# Patient Record
Sex: Male | Born: 1990 | Race: Black or African American | Hispanic: No | Marital: Single | Smoking: Current every day smoker
Health system: Southern US, Community
[De-identification: ages and names within clinical notes are randomized; demographics above are authoritative.]

## PROBLEM LIST (undated history)

## (undated) DIAGNOSIS — I10 Essential (primary) hypertension: Secondary | ICD-10-CM

## (undated) HISTORY — PX: OTHER SURGICAL HISTORY: SHX169

## (undated) HISTORY — DX: Essential (primary) hypertension: I10

---

## 2008-03-09 ENCOUNTER — Emergency Department (HOSPITAL_COMMUNITY): Admission: EM | Admit: 2008-03-09 | Discharge: 2008-03-10 | Payer: Self-pay | Admitting: Emergency Medicine

## 2008-03-11 ENCOUNTER — Emergency Department (HOSPITAL_COMMUNITY): Admission: EM | Admit: 2008-03-11 | Discharge: 2008-03-11 | Payer: Self-pay | Admitting: Emergency Medicine

## 2009-04-25 ENCOUNTER — Ambulatory Visit: Payer: Self-pay | Admitting: Diagnostic Radiology

## 2009-04-25 ENCOUNTER — Emergency Department (HOSPITAL_BASED_OUTPATIENT_CLINIC_OR_DEPARTMENT_OTHER): Admission: EM | Admit: 2009-04-25 | Discharge: 2009-04-25 | Payer: Self-pay | Admitting: Emergency Medicine

## 2009-10-19 ENCOUNTER — Emergency Department (HOSPITAL_BASED_OUTPATIENT_CLINIC_OR_DEPARTMENT_OTHER): Admission: EM | Admit: 2009-10-19 | Discharge: 2009-10-19 | Payer: Self-pay | Admitting: Emergency Medicine

## 2009-10-19 ENCOUNTER — Ambulatory Visit: Payer: Self-pay | Admitting: Diagnostic Radiology

## 2010-06-20 LAB — DIFFERENTIAL
Basophils Absolute: 0 10*3/uL (ref 0.0–0.1)
Eosinophils Relative: 4 % (ref 0–5)
Lymphocytes Relative: 29 % (ref 12–46)
Lymphs Abs: 2.4 10*3/uL (ref 0.7–4.0)
Monocytes Absolute: 0.5 10*3/uL (ref 0.1–1.0)
Monocytes Relative: 6 % (ref 3–12)
Neutro Abs: 5.2 10*3/uL (ref 1.7–7.7)
Neutrophils Relative %: 60 % (ref 43–77)

## 2010-06-20 LAB — CBC
Hemoglobin: 14.5 g/dL (ref 13.0–17.0)
MCH: 30 pg (ref 26.0–34.0)
MCHC: 33.5 g/dL (ref 30.0–36.0)
MCV: 89.5 fL (ref 78.0–100.0)

## 2010-06-20 LAB — RAPID STREP SCREEN (MED CTR MEBANE ONLY): Streptococcus, Group A Screen (Direct): POSITIVE — AB
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
## 2016-09-21 ENCOUNTER — Ambulatory Visit (HOSPITAL_COMMUNITY)
Admission: EM | Admit: 2016-09-21 | Discharge: 2016-09-21 | Disposition: A | Discharge status: Home / Self Care | Payer: Self-pay | Attending: Family Medicine | Admitting: Family Medicine

## 2016-09-21 ENCOUNTER — Encounter (HOSPITAL_COMMUNITY): Payer: Self-pay | Admitting: Emergency Medicine

## 2016-09-21 DIAGNOSIS — K029 Dental caries, unspecified: Secondary | ICD-10-CM

## 2016-09-21 MED ORDER — HYDROCODONE-ACETAMINOPHEN 5-325 MG PO TABS: 1.0000 | ORAL_TABLET | Freq: Four times a day (QID) | ORAL | 0 refills | Status: DC | PRN

## 2016-09-21 MED ORDER — CLINDAMYCIN HCL 150 MG PO CAPS: 150.0000 mg | ORAL_CAPSULE | Freq: Four times a day (QID) | ORAL | 0 refills | Status: DC

## 2016-09-21 NOTE — Discharge Instructions (Signed)
Call the dentist listed below.  He may want to go [facility] is providing dental care twice a year.

## 2016-09-21 NOTE — ED Provider Notes (Signed)
[facility]    CSN: [id number] Arrival date & time: 09/21/16  1016     History   Chief Complaint Chief Complaint  Patient presents with  . Dental Pain    HPI Evan Ramos is a 26 y.o. male.   The patient presented to the [facility] with a complaint of dental pain that started 2 days ago and has progressed into a sore throat as well. He took penicillin yesterday but the pain is worse today. The area of discomfort is tooth #2. 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 He has no dental insurance.      History reviewed. No pertinent past medical history.  There are no active problems to display for this patient.   History reviewed. No pertinent surgical history.     Home Medications    Prior to Admission medications   Medication Sig Start Date End Date Taking? Authorizing Provider  clindamycin (CLEOCIN) 150 MG capsule Take 1 capsule (150 mg total) by mouth every 6 (six) hours. 09/21/16   Elvina Sidle, MD  HYDROcodone-acetaminophen (NORCO) 5-325 MG tablet Take 1 tablet by mouth every 6 (six) hours as needed for moderate pain. 09/21/16   Elvina Sidle, MD    Family History History reviewed. No pertinent family history.  Social History Social History  Substance Use Topics  . Smoking status: Current Every Day Smoker    Packs/day: 2.00    Years: 4.00    Types: Cigarettes  . Smokeless tobacco: Never Used  . Alcohol use No     Allergies   Patient has no known allergies.   Review of Systems Review of Systems  HENT: Positive for dental problem.   All other systems reviewed and are negative.    Physical Exam Triage Vital Signs ED Triage Vitals  Enc Vitals Group     BP 09/21/16 1055 137/70     Pulse Rate 09/21/16 1055 88     Resp 09/21/16 1055 18     Temp 09/21/16 1055 98.3 F (36.8 C)     Temp Source 09/21/16 1055 Oral     SpO2 09/21/16 1055 100 %     Weight --      Height --      Head Circumference  --      Peak Flow --      Pain Score 09/21/16 1053 10     Pain Loc --      Pain Edu? --      Excl. in GC? --    No data found.   Updated Vital Signs BP 137/70 (BP Location: Right Arm)   Pulse 88   Temp 98.3 F (36.8 C) (Oral)   Resp 18   SpO2 100%     Physical Exam  Constitutional: He is oriented to person, place, and time. He appears well-developed and well-nourished.  HENT:  Right Ear: External ear normal.  Left Ear: External ear normal.  Multiple dental caries throughout his mouth, most notably in tooth #2  Eyes: Conjunctivae and EOM are normal. Pupils are equal, round, and reactive to light.  Neck: Normal range of motion. Neck supple.  Cardiovascular: Normal rate.   Pulmonary/Chest: Effort normal.  Musculoskeletal: Normal range of motion.  Neurological: He is alert and oriented to person, place, and time.  Skin: Skin is warm and dry.  Nursing note and vitals reviewed.    UC Treatments / Results  Labs (all labs ordered are listed, but only abnormal  results are displayed) Labs Reviewed - No data to display  EKG  EKG Interpretation None       Radiology No results found.  Procedures Procedures (including critical care time)  Medications Ordered in UC Medications - No data to display   Initial Impression / Assessment and Plan / UC Course  I have reviewed the triage vital signs and the nursing notes.  Pertinent labs & imaging results that were available during my care of the patient were reviewed by me and considered in my medical decision making (see chart for details).     Final Clinical Impressions(s) / UC Diagnoses   Final diagnoses:  Dental caries    New Prescriptions New Prescriptions   CLINDAMYCIN (CLEOCIN) 150 MG CAPSULE    Take 1 capsule (150 mg total) by mouth every 6 (six) hours.   HYDROCODONE-ACETAMINOPHEN (NORCO) 5-325 MG TABLET    Take 1 tablet by mouth every 6 (six) hours as needed for moderate pain.     Elvina SidleLauenstein, Lorenza Winkleman,  MD 09/21/16 1105

## 2016-09-21 NOTE — ED Triage Notes (Signed)
[facility] with a complaint of dental pain that started 2 days ago and has progressed into a sore throat as well.

## 2020-10-23 ENCOUNTER — Emergency Department (HOSPITAL_BASED_OUTPATIENT_CLINIC_OR_DEPARTMENT_OTHER)
Admission: EM | Admit: 2020-10-23 | Discharge: 2020-10-24 | Disposition: A | Discharge status: Home / Self Care | Payer: Self-pay | Attending: Emergency Medicine | Admitting: Emergency Medicine

## 2020-10-23 ENCOUNTER — Encounter (HOSPITAL_BASED_OUTPATIENT_CLINIC_OR_DEPARTMENT_OTHER): Payer: Self-pay | Admitting: Emergency Medicine

## 2020-10-23 ENCOUNTER — Other Ambulatory Visit: Payer: Self-pay

## 2020-10-23 ENCOUNTER — Emergency Department (HOSPITAL_BASED_OUTPATIENT_CLINIC_OR_DEPARTMENT_OTHER): Payer: Self-pay

## 2020-10-23 DIAGNOSIS — G4452 New daily persistent headache (NDPH): Secondary | ICD-10-CM

## 2020-10-23 DIAGNOSIS — R030 Elevated blood-pressure reading, without diagnosis of hypertension: Secondary | ICD-10-CM

## 2020-10-23 DIAGNOSIS — F17210 Nicotine dependence, cigarettes, uncomplicated: Secondary | ICD-10-CM | POA: Insufficient documentation

## 2020-10-23 NOTE — ED Provider Notes (Signed)
[facility] Provider Note: Evan Dell, MD, FACEP  CSN: [id number] MRN: [id number] ARRIVAL: 10/23/20 at 2236 ROOM: MH07/MH07   CHIEF COMPLAINT  Headache   HISTORY OF PRESENT ILLNESS  10/23/20 10:57 PM Evan Ramos is a 30 y.o. male who has been having headaches for about the past 3 weeks.  He previously had no history of headaches and has no personal or family history of migraines.  He describes the headaches as occurring multiple times a day.  They began in the left periorbital area and then subsequently moved to the left occiput.  He describes them as aching and throbbing and rates their pain as a 6 out of 10.  It is not the worst headache he has ever had but the frequency is concerning him.  He has no associated blurred vision, or neurodeficits, ptosis or lacrimation.  They usually resolve with Tylenol.  He checked his blood pressure earlier and found to be high so he was concerned that the blood pressure may be causing his headaches.  He has no associated numbness or weakness.  He has no associated photophobia, nausea or vomiting.   History reviewed. No pertinent past medical history.  History reviewed. No pertinent surgical history.  Family History  Problem Relation Age of Onset   Cancer Mother    Cancer Father    Hypertension Other     Social History   Tobacco Use   Smoking status: Every Day    Packs/day: 1.00    Years: 4.00    Pack years: 4.00    Types: Cigarettes   Smokeless tobacco: Never  Vaping Use   Vaping Use: Never used  Substance Use Topics   Alcohol use: Yes    Comment: socially   Drug use: Yes    Types: Marijuana    Prior to Admission medications   Medication Sig Start Date End Date Taking? Authorizing Provider  amLODipine (NORVASC) 5 MG tablet Take 1 tablet (5 mg total) by mouth daily. 10/24/20  Yes Geoffery Aultman, MD    Allergies Patient has no known allergies.   REVIEW OF SYSTEMS  Negative except as noted here or in the  History of Present Illness.   PHYSICAL EXAMINATION  Initial Vital Signs Blood pressure (!) 144/95, pulse 76, temperature 98.6 F (37 C), temperature source Oral, resp. rate 18, height 5\' 5"  (1.651 m), weight 83.9 kg, SpO2 100 %.  Examination General: Well-developed, well-nourished male in no acute distress; appearance consistent with age of record HENT: normocephalic; atraumatic Eyes: pupils equal, round and reactive to light; extraocular muscles intact Neck: supple Heart: regular rate and rhythm Lungs: clear to auscultation bilaterally Abdomen: soft; nondistended; nontender; bowel sounds present Extremities: No deformity; full range of motion; pulses normal Neurologic: Awake, alert and oriented; motor function intact in all extremities and symmetric; sensation intact and symmetric in upper extremities, lower extremities and face; no facial droop Skin: Warm and dry Psychiatric: Normal mood and affect   RESULTS  Summary of this visit's results, reviewed and interpreted by myself:   EKG Interpretation  Date/Time:    Ventricular Rate:    PR Interval:    QRS Duration:   QT Interval:    QTC Calculation:   R Axis:     Text Interpretation:         Laboratory Studies: No results found for this or any previous visit (from the past 24 hour(s)). Imaging Studies: CT Head Wo Contrast  Result Date: 10/23/2020 CLINICAL DATA:  Headache, chronic, new  features or increased frequency Daily headaches for 2 weeks. EXAM: CT HEAD WITHOUT CONTRAST TECHNIQUE: Contiguous axial images were obtained from the base of the skull through the vertex without intravenous contrast. COMPARISON:  Remote head CT 10/19/2009 FINDINGS: Brain: No intracranial hemorrhage, mass effect, or midline shift. No hydrocephalus. The basilar cisterns are patent. No evidence of territorial infarct or acute ischemia. No extra-axial or intracranial fluid collection. Vascular: No hyperdense vessel or unexpected calcification.  Skull: No fracture or focal lesion. Sinuses/Orbits: Tiny mucous retention cyst in the right maxillary sinus and left side of sphenoid sinus. No fluid levels or significant mucosal thickening. The mastoid air cells are clear. Unremarkable orbits. Other: None. IMPRESSION: Negative noncontrast head CT. Electronically Signed   By: Narda Rutherford M.D.   On: 10/23/2020 23:33    ED COURSE and MDM  Nursing notes, initial and subsequent vitals signs, including pulse oximetry, reviewed and interpreted by myself.  Vitals:   10/23/20 2244 10/23/20 2246  BP:  (!) 144/95  Pulse:  76  Resp:  18  Temp:  98.6 F (37 C)  TempSrc:  Oral  SpO2:  100%  Weight: 83.9 kg   Height: 5\' 5"  (1.651 m)    Medications  naproxen (NAPROSYN) tablet 500 mg (has no administration in time range)  amLODipine (NORVASC) tablet 5 mg (has no administration in time range)    Patient advised of reassuring CT scan.  His blood pressures have been mildly elevated here and I think it is reasonable to start him on a calcium channel blocker pending establishment with a PCP.  He was advised of the dangers long-term of untreated hypertension.  His headaches are not severe and as they are controlled with over-the-counter medications I do not believe any aggressive intervention is needed.  PROCEDURES  Procedures   ED DIAGNOSES     ICD-10-CM   1. New persistent daily headache  G44.52     2. Elevated blood pressure reading without diagnosis of hypertension  R03.0          Jahmire Ruffins, MD 10/24/20 0005

## 2020-10-23 NOTE — ED Triage Notes (Signed)
Pt states he has been having headaches everyday for the past 2 weeks  Pt states he checked his blood pressure tonight and it read high so he came in

## 2020-10-24 MED ORDER — AMLODIPINE BESYLATE 5 MG PO TABS: 5.0000 mg | ORAL_TABLET | Freq: Every day | ORAL | 0 refills | Status: AC

## 2020-10-24 MED ORDER — NAPROXEN 250 MG PO TABS
500.0000 mg | ORAL_TABLET | Freq: Once | ORAL | Status: AC
  Administered 2020-10-24: 500 mg via ORAL
  Filled 2020-10-24: qty 2

## 2020-10-24 MED ORDER — AMLODIPINE BESYLATE 5 MG PO TABS
5.0000 mg | ORAL_TABLET | Freq: Once | ORAL | Status: AC
  Administered 2020-10-24: 5 mg via ORAL
  Filled 2020-10-24: qty 1

## 2021-01-14 ENCOUNTER — Ambulatory Visit: Payer: Self-pay | Admitting: Neurology

## 2021-05-19 ENCOUNTER — Emergency Department (HOSPITAL_BASED_OUTPATIENT_CLINIC_OR_DEPARTMENT_OTHER): Payer: Self-pay

## 2021-05-19 ENCOUNTER — Encounter (HOSPITAL_BASED_OUTPATIENT_CLINIC_OR_DEPARTMENT_OTHER): Payer: Self-pay

## 2021-05-19 ENCOUNTER — Other Ambulatory Visit: Payer: Self-pay

## 2021-05-19 ENCOUNTER — Emergency Department (HOSPITAL_BASED_OUTPATIENT_CLINIC_OR_DEPARTMENT_OTHER)
Admission: EM | Admit: 2021-05-19 | Discharge: 2021-05-19 | Disposition: A | Discharge status: Home / Self Care | Payer: Self-pay | Attending: Emergency Medicine | Admitting: Emergency Medicine

## 2021-05-19 DIAGNOSIS — R0602 Shortness of breath: Secondary | ICD-10-CM | POA: Insufficient documentation

## 2021-05-19 DIAGNOSIS — D72829 Elevated white blood cell count, unspecified: Secondary | ICD-10-CM | POA: Insufficient documentation

## 2021-05-19 DIAGNOSIS — R072 Precordial pain: Secondary | ICD-10-CM | POA: Insufficient documentation

## 2021-05-19 LAB — COMPREHENSIVE METABOLIC PANEL
ALT: 36 U/L (ref 0–44)
AST: 23 U/L (ref 15–41)
Albumin: 4.2 g/dL (ref 3.5–5.0)
Alkaline Phosphatase: 64 U/L (ref 38–126)
Anion gap: 7 (ref 5–15)
BUN: 11 mg/dL (ref 6–20)
CO2: 25 mmol/L (ref 22–32)
Calcium: 9.5 mg/dL (ref 8.9–10.3)
Chloride: 104 mmol/L (ref 98–111)
Creatinine, Ser: 0.84 mg/dL (ref 0.61–1.24)
GFR, Estimated: >60 mL/min (ref >60)
Glucose, Bld: 99 mg/dL (ref 70–99)
Potassium: 4.2 mmol/L (ref 3.5–5.1)
Sodium: 136 mmol/L (ref 135–145)
Total Bilirubin: 0.6 mg/dL (ref 0.3–1.2)
Total Protein: 7.6 g/dL (ref 6.5–8.1)

## 2021-05-19 LAB — CBC WITH DIFFERENTIAL/PLATELET
Abs Immature Granulocytes: 0.08 10*3/uL — ABNORMAL HIGH (ref 0.00–0.07)
Basophils Absolute: 0.1 10*3/uL (ref 0.0–0.1)
Basophils Relative: 0 %
Eosinophils Absolute: 0.3 10*3/uL (ref 0.0–0.5)
Eosinophils Relative: 1 %
HCT: 42.4 % (ref 39.0–52.0)
Hemoglobin: 14.4 g/dL (ref 13.0–17.0)
Immature Granulocytes: 0 %
Lymphocytes Relative: 19 %
Lymphs Abs: 3.8 10*3/uL (ref 0.7–4.0)
MCH: 30.1 pg (ref 26.0–34.0)
MCHC: 34 g/dL (ref 30.0–36.0)
MCV: 88.7 fL (ref 80.0–100.0)
Monocytes Absolute: 1.2 10*3/uL — ABNORMAL HIGH (ref 0.1–1.0)
Monocytes Relative: 6 %
Neutro Abs: 14.9 10*3/uL — ABNORMAL HIGH (ref 1.7–7.7)
Neutrophils Relative %: 74 %
Platelets: 253 10*3/uL (ref 150–400)
RBC: 4.78 MIL/uL (ref 4.22–5.81)
RDW: 13 % (ref 11.5–15.5)
Smear Review: NORMAL
WBC: 20.3 10*3/uL — ABNORMAL HIGH (ref 4.0–10.5)
nRBC: 0 % (ref 0.0–0.2)

## 2021-05-19 LAB — TROPONIN I (HIGH SENSITIVITY)
Troponin I (High Sensitivity): 3 ng/L (ref <18)
Troponin I (High Sensitivity): 4 ng/L (ref <18)

## 2021-05-19 LAB — D-DIMER, QUANTITATIVE: D-Dimer, Quant: <0.27 ug/mL-FEU (ref 0.00–0.50)

## 2021-05-19 NOTE — ED Provider Notes (Signed)
Emergency Department Provider Note   I have reviewed the triage vital signs and the nursing notes.   HISTORY  Chief Complaint Chest Pain   HPI Evan Ramos is a 31 y.o. male with no known past medical history presents to the emergency department with left-sided chest discomfort.  Symptoms have been intermittent over the past week.  Describes a left chest tightness mainly in the morning.  He states that 1 or 2 mornings he did wake up with some diaphoresis.  He states that pain last for around 5 minutes and then resolves.  He would do things like stretch or go outside to get some air and symptoms would improve.  He would not have chest pain the remaining day.  No exertional or pleuritic discomfort.  Today he was driving and noticed some pain in the chest again and so decided to come in for evaluation.  No family history of ACS or early cardiac death.  No pain or swelling in the legs.  He did note some mild shortness of breath with symptoms today.  No active pain currently.     History reviewed. No pertinent past medical history.  Review of Systems  Constitutional: No fever/chills Eyes: No visual changes. ENT: No sore throat. Cardiovascular: Positive chest pain. Respiratory: Denies shortness of breath. Gastrointestinal: No abdominal pain.  No nausea, no vomiting.  No diarrhea.  No constipation. Genitourinary: Negative for dysuria. Musculoskeletal: Negative for back pain. Skin: Negative for rash. Neurological: Negative for headaches, focal weakness or numbness.   ____________________________________________   PHYSICAL EXAM:  VITAL SIGNS: ED Triage Vitals  Enc Vitals Group     BP 05/19/21 1120 (!) 141/88     Pulse Rate 05/19/21 1120 65     Resp 05/19/21 1120 (!) 22     Temp 05/19/21 1120 98.4 F (36.9 C)     Temp Source 05/19/21 1120 Oral     SpO2 05/19/21 1120 100 %     Weight 05/19/21 1118 185 lb (83.9 kg)   Constitutional: Alert and oriented. Well appearing  and in no acute distress. Eyes: Conjunctivae are normal.  Head: Atraumatic. Nose: No congestion/rhinnorhea. Mouth/Throat: Mucous membranes are moist.  Neck: No stridor.   Cardiovascular: Normal rate, regular rhythm. Good peripheral circulation. Grossly normal heart sounds.   Respiratory: Normal respiratory effort.  No retractions. Lungs CTAB. Gastrointestinal: Soft and nontender. No distention.  Musculoskeletal: No gross deformities of extremities. Neurologic:  Normal speech and language.  Skin:  Skin is warm, dry and intact. No rash noted.   ____________________________________________   LABS (all labs ordered are listed, but only abnormal results are displayed)  Labs Reviewed  CBC WITH DIFFERENTIAL/PLATELET - Abnormal; Notable for the following components:      Result Value   WBC 20.3 (*)    Neutro Abs 14.9 (*)    Monocytes Absolute 1.2 (*)    Abs Immature Granulocytes 0.08 (*)    All other components within normal limits  COMPREHENSIVE METABOLIC PANEL  D-DIMER, QUANTITATIVE  TROPONIN I (HIGH SENSITIVITY)  TROPONIN I (HIGH SENSITIVITY)   ____________________________________________  EKG   EKG Interpretation  Date/Time:  Tuesday May 19 2021 11:19:14 EST Ventricular Rate:  59 PR Interval:  144 QRS Duration: 84 QT Interval:  368 QTC Calculation: 364 R Axis:   13 Text Interpretation: Sinus bradycardia with sinus arrhythmia Otherwise normal ECG No previous ECGs available Confirmed by Nanda Quinton [phone]) on 05/19/2021 11:21:55 AM        ____________________________________________  RADIOLOGY  Darletta Moll  Chest 2 View  Result Date: 05/19/2021 CLINICAL DATA:  Chest pain. EXAM: CHEST - 2 VIEW COMPARISON:  April 25, 2009. FINDINGS: The heart size and mediastinal contours are within normal limits. Both lungs are clear. The visualized skeletal structures are unremarkable. IMPRESSION: No active cardiopulmonary disease. Electronically Signed   By: Marijo Conception M.D.   On:  05/19/2021 11:41    ____________________________________________   PROCEDURES  Procedure(s) performed:   Procedures  None  ____________________________________________   INITIAL IMPRESSION / ASSESSMENT AND PLAN / ED COURSE  Pertinent labs & imaging results that were available during my care of the patient were reviewed by me and considered in my medical decision making (see chart for details).   This patient is Presenting for Evaluation of CP, which does require a range of treatment options, and is a complaint that involves a high risk of morbidity and mortality.  The Differential Diagnoses includes all life-threatening causes for chest pain. This includes but is not exclusive to acute coronary syndrome, aortic dissection, pulmonary embolism, cardiac tamponade, community-acquired pneumonia, pericarditis, musculoskeletal chest wall pain, etc.  I decided to review pertinent External Data, and in summary no prior cardiac workup in our system.   Clinical Laboratory Tests Ordered, included troponins which are negative x2.  D-dimer which is within normal limits.  Patient does have leukocytosis of 20.  He is afebrile.  He is not having infection signs or symptoms either here or at home.  Kidney function is within normal limits.  LFTs and bilirubin are normal.  Electrolytes normal.  Radiologic Tests Ordered, included CXR. I independently interpreted the images and agree with radiology interpretation.   Cardiac Monitor Tracing which shows NSR  Social Determinants of Health Risk patient is a smoker.    Medical Decision Making: Summary:  Patient presents to the emergency department for evaluation of chest discomfort.  Pain has some typical but also atypical qualities.  Seems to be mostly in the morning but today occurred with driving.  His EKG interpreted by me as above shows nonspecific ST changes and elevated J-point but no clear ischemic change or reciprocal changes in other leads.  His  blood pressure is elevated from time to time according to him but does not have this history.  He is a smoker.  No strong family history of ACS.  His troponins are negative x2 and D-dimer is negative as well.  Low risk by Rock Nephew for PE.  Given his EKG I do plan for referral to cardiology for further evaluation.  He is also a smoker which increases his risk.   His leukocytosis to 20 is somewhat difficult to interpret.  He is not having any signs or symptoms of infection such as pneumonia, intra-abdominal process, myocarditis.  Myocarditis further evaluated with negative troponins and no ectopy on telemetry monitoring.  He does not appear to be in decompensated CHF.   Reevaluation with update and discussion with patient regarding plan for d/c and Cardiology/PCP follow up. No active pain. Discussed strict ED return precautions.   Disposition: discharge  ____________________________________________  FINAL CLINICAL IMPRESSION(S) / ED DIAGNOSES  Final diagnoses:  Precordial chest pain    Note:  This document was prepared using Dragon voice recognition software and may include unintentional dictation errors.  Nanda Quinton, MD, [facility]    Karrington Mccravy, Wonda Olds, MD 05/19/21 [phone]

## 2021-05-19 NOTE — ED Triage Notes (Addendum)
Pt reports left sided chest pain x one week. Initially pain only in morning, now pain is throughout day. Today pt reports SOB

## 2021-05-19 NOTE — Discharge Instructions (Signed)

## 2021-05-19 NOTE — ED Notes (Addendum)
Patient transported to X-ray 

## 2021-05-20 ENCOUNTER — Encounter: Payer: Self-pay | Admitting: Cardiology

## 2021-05-20 ENCOUNTER — Ambulatory Visit (HOSPITAL_BASED_OUTPATIENT_CLINIC_OR_DEPARTMENT_OTHER)
Admission: RE | Admit: 2021-05-20 | Discharge: 2021-05-20 | Disposition: A | Discharge status: Home / Self Care | Payer: Self-pay | Source: Ambulatory Visit | Attending: Cardiology | Admitting: Cardiology

## 2021-05-20 ENCOUNTER — Ambulatory Visit (INDEPENDENT_AMBULATORY_CARE_PROVIDER_SITE_OTHER): Payer: Self-pay | Admitting: Cardiology

## 2021-05-20 VITALS — BP 134/66 | HR 55 | Ht 65.0 in | Wt 187.0 lb

## 2021-05-20 DIAGNOSIS — I10 Essential (primary) hypertension: Secondary | ICD-10-CM

## 2021-05-20 DIAGNOSIS — R072 Precordial pain: Secondary | ICD-10-CM

## 2021-05-20 DIAGNOSIS — Z720 Tobacco use: Secondary | ICD-10-CM

## 2021-05-20 LAB — ECHOCARDIOGRAM COMPLETE
AR max vel: 2.35 cm2
AV Area VTI: 2.41 cm2
AV Area mean vel: 2.14 cm2
AV Mean grad: 4 mmHg
AV Peak grad: 8.1 mmHg
Ao pk vel: 1.42 m/s
Area-P 1/2: 3.6 cm2
Height: 65 in
S' Lateral: 3.3 cm
Weight: 2992 oz

## 2021-05-20 NOTE — Patient Instructions (Signed)
°  Testing/Procedures:  Your physician has requested that you have an echocardiogram. Echocardiography is a painless test that uses sound waves to create images of your heart. It provides your doctor with information about the size and shape of your heart and how well your hearts chambers and valves are working. This procedure takes approximately one hour. There are no restrictions for this procedure. [facility] [facility]-1ST FLOOR IMAGING DEPARTMENT   Follow-Up: At [facility], you and your health needs are our priority.  As part of our continuing mission to provide you with exceptional heart care, we have created designated Provider Care Teams.  These Care Teams include your primary Cardiologist (physician) and Advanced Practice Providers (APPs -  Physician Assistants and Nurse Practitioners) who all work together to provide you with the care you need, when you need it.  We recommend signing up for the patient portal called "MyChart".  Sign up information is provided on this After Visit Summary.  MyChart is used to connect with patients for Virtual Visits (Telemedicine).  Patients are able to view lab/test results, encounter notes, upcoming appointments, etc.  Non-urgent messages can be sent to your provider as well.   To learn more about what you can do with MyChart, go to [email or web address].    Your next appointment:    AS NEEDED

## 2021-05-20 NOTE — Progress Notes (Signed)
°  Echocardiogram 2D Echocardiogram has been performed.  Roosvelt Maser F 05/20/2021, 1:48 PM

## 2021-05-20 NOTE — Progress Notes (Signed)
Referring-Evan Long MD Reason for referral-Chest pain   HPI: 31 year old male for evaluation of chest pain at request of Vonna Kotyk MD.  Patient seen for chest pain in the emergency May 19, 2021.  Chest x-ray showed no infiltrates.  Troponins were normal.  Potassium was 4.2, creatinine 0.84, liver functions normal, hemoglobin 14.4 and normal D-dimer.  Patient states that he has had intermittent chest pain for 1 week.  It is in the substernal area and described as a "gas" sensation.  Typically lasts 5 to 10 minutes and resolve spontaneously.  Some increase with inspiration or certain movements.  He does not have exertional chest pain.  The pain does not radiate and there are no associated symptoms.  Cardiology now asked to evaluate.  Note he denies dyspnea on exertion, orthopnea, PND, pedal edema, exertional chest pain or syncope.  Current Outpatient Medications  Medication Sig Dispense Refill   amLODipine (NORVASC) 5 MG tablet Take 1 tablet (5 mg total) by mouth daily. 30 tablet 0   No current facility-administered medications for this visit.    No Known Allergies   Past Medical History:  Diagnosis Date   Hypertension     Past Surgical History:  Procedure Laterality Date   No prior surgery      Social History   Socioeconomic History   Marital status: Single    Spouse name: Not on file   Number of children: 2   Years of education: Not on file   Highest education level: Not on file  Occupational History   Not on file  Tobacco Use   Smoking status: Every Day    Packs/day: 1.00    Years: 4.00    Pack years: 4.00    Types: Cigarettes   Smokeless tobacco: Never  Vaping Use   Vaping Use: Never used  Substance and Sexual Activity   Alcohol use: Yes    Comment: socially   Drug use: Yes    Types: Marijuana   Sexual activity: Not on file  Other Topics Concern   Not on file  Social History Narrative   Not on file   Social Determinants of Health   Financial  Resource Strain: Not on file  Food Insecurity: Not on file  Transportation Needs: Not on file  Physical Activity: Not on file  Stress: Not on file  Social Connections: Not on file  Intimate Partner Violence: Not on file    Family History  Problem Relation Age of Onset   Cancer Mother    Cancer Father    Hypertension Other     ROS: no fevers or chills, productive cough, hemoptysis, dysphasia, odynophagia, melena, hematochezia, dysuria, hematuria, rash, seizure activity, orthopnea, PND, pedal edema, claudication. Remaining systems are negative.  Physical Exam:   Blood pressure 134/66, pulse (!) 55, height 5\' 5"  (1.651 m), weight 187 lb (84.8 kg), SpO2 98 %.  General:  Well developed/well nourished in NAD Skin warm/dry Patient not depressed No peripheral clubbing Back-normal HEENT-normal/normal eyelids Neck supple/normal carotid upstroke bilaterally; no bruits; no JVD; no thyromegaly chest - CTA/ normal expansion CV - RRR/normal S1 and S2; no murmurs, rubs or gallops;  PMI nondisplaced Abdomen -NT/ND, no HSM, no mass, + bowel sounds, no bruit 2+ femoral pulses, no bruits Ext-no edema, chords, 2+ DP Neuro-grossly nonfocal  ECG -May 19, 2021-sinus bradycardia with early repolarization abnormality.  Personally reviewed  A/P  1 chest pain-symptoms atypical.  Increase with inspiration.  Note recent electrocardiogram showed early repolarization but no other  ST changes.  Troponins and D-dimer negative.  I will arrange an echocardiogram to rule out pericardial effusion and to evaluate wall motion.  If normal we will not pursue further evaluation.  There may be also be a contribution from musculoskeletal system.  2 tobacco abuse-patient counseled on discontinuing.  3 elevated white blood cell count-patient needs follow-up with primary care.  4 recently diagnosed hypertension-continue amlodipine and follow with primary care.  Kirk Ruths, MD

## 2022-06-14 ENCOUNTER — Emergency Department (HOSPITAL_BASED_OUTPATIENT_CLINIC_OR_DEPARTMENT_OTHER): Payer: Medicaid Other

## 2022-06-14 ENCOUNTER — Emergency Department (HOSPITAL_BASED_OUTPATIENT_CLINIC_OR_DEPARTMENT_OTHER)
Admission: EM | Admit: 2022-06-14 | Discharge: 2022-06-14 | Disposition: A | Discharge status: Home / Self Care | Payer: Medicaid Other | Attending: Emergency Medicine | Admitting: Emergency Medicine

## 2022-06-14 ENCOUNTER — Encounter (HOSPITAL_BASED_OUTPATIENT_CLINIC_OR_DEPARTMENT_OTHER): Payer: Self-pay | Admitting: Radiology

## 2022-06-14 DIAGNOSIS — J039 Acute tonsillitis, unspecified: Secondary | ICD-10-CM | POA: Insufficient documentation

## 2022-06-14 DIAGNOSIS — Z1152 Encounter for screening for COVID-19: Secondary | ICD-10-CM | POA: Insufficient documentation

## 2022-06-14 DIAGNOSIS — I10 Essential (primary) hypertension: Secondary | ICD-10-CM | POA: Insufficient documentation

## 2022-06-14 DIAGNOSIS — Z79899 Other long term (current) drug therapy: Secondary | ICD-10-CM | POA: Insufficient documentation

## 2022-06-14 DIAGNOSIS — J029 Acute pharyngitis, unspecified: Secondary | ICD-10-CM | POA: Diagnosis present

## 2022-06-14 LAB — CBC WITH DIFFERENTIAL/PLATELET
Abs Immature Granulocytes: 0.09 10*3/uL — ABNORMAL HIGH (ref 0.00–0.07)
Basophils Absolute: 0.1 10*3/uL (ref 0.0–0.1)
Basophils Relative: 0 %
Eosinophils Absolute: 0.6 10*3/uL — ABNORMAL HIGH (ref 0.0–0.5)
Eosinophils Relative: 2 %
HCT: 44.2 % (ref 39.0–52.0)
Hemoglobin: 15 g/dL (ref 13.0–17.0)
Immature Granulocytes: 0 %
Lymphocytes Relative: 14 %
Lymphs Abs: 3.5 10*3/uL (ref 0.7–4.0)
MCH: 29.8 pg (ref 26.0–34.0)
MCHC: 33.9 g/dL (ref 30.0–36.0)
MCV: 87.7 fL (ref 80.0–100.0)
Monocytes Absolute: 1.5 10*3/uL — ABNORMAL HIGH (ref 0.1–1.0)
Monocytes Relative: 6 %
Neutro Abs: 18.5 10*3/uL — ABNORMAL HIGH (ref 1.7–7.7)
Neutrophils Relative %: 78 %
Platelets: 271 10*3/uL (ref 150–400)
RBC: 5.04 MIL/uL (ref 4.22–5.81)
RDW: 13 % (ref 11.5–15.5)
WBC: 24.2 10*3/uL — ABNORMAL HIGH (ref 4.0–10.5)
nRBC: 0 % (ref 0.0–0.2)

## 2022-06-14 LAB — COMPREHENSIVE METABOLIC PANEL
ALT: 33 U/L (ref 0–44)
AST: 24 U/L (ref 15–41)
Albumin: 4 g/dL (ref 3.5–5.0)
Alkaline Phosphatase: 78 U/L (ref 38–126)
Anion gap: 6 (ref 5–15)
BUN: 9 mg/dL (ref 6–20)
CO2: 23 mmol/L (ref 22–32)
Calcium: 8.3 mg/dL — ABNORMAL LOW (ref 8.9–10.3)
Chloride: 99 mmol/L (ref 98–111)
Creatinine, Ser: 0.91 mg/dL (ref 0.61–1.24)
GFR, Estimated: >60 mL/min (ref >60)
Glucose, Bld: 94 mg/dL (ref 70–99)
Potassium: 3.6 mmol/L (ref 3.5–5.1)
Sodium: 128 mmol/L — ABNORMAL LOW (ref 135–145)
Total Bilirubin: 0.7 mg/dL (ref 0.3–1.2)
Total Protein: 7.9 g/dL (ref 6.5–8.1)

## 2022-06-14 LAB — RESP PANEL BY RT-PCR (RSV, FLU A&B, COVID)  RVPGX2
Influenza A by PCR: NEGATIVE
Influenza B by PCR: NEGATIVE
Resp Syncytial Virus by PCR: NEGATIVE
SARS Coronavirus 2 by RT PCR: NEGATIVE

## 2022-06-14 LAB — LIPASE, BLOOD: Lipase: 41 U/L (ref 11–51)

## 2022-06-14 LAB — LACTIC ACID, PLASMA: Lactic Acid, Venous: 0.8 mmol/L (ref 0.5–1.9)

## 2022-06-14 LAB — GROUP A STREP BY PCR: Group A Strep by PCR: DETECTED — AB

## 2022-06-14 MED ORDER — AMOXICILLIN-POT CLAVULANATE 875-125 MG PO TABS: 1.0000 | ORAL_TABLET | Freq: Two times a day (BID) | ORAL | 0 refills | Status: AC

## 2022-06-14 MED ORDER — DEXAMETHASONE SODIUM PHOSPHATE 10 MG/ML IJ SOLN
INTRAMUSCULAR | Status: AC
  Filled 2022-06-14: qty 1

## 2022-06-14 MED ORDER — IOHEXOL 300 MG/ML  SOLN
100.0000 mL | Freq: Once | INTRAMUSCULAR | Status: AC | PRN
  Administered 2022-06-14: 75 mL via INTRAVENOUS

## 2022-06-14 MED ORDER — PIPERACILLIN-TAZOBACTAM 3.375 G IVPB 30 MIN
3.3750 g | Freq: Once | INTRAVENOUS | Status: AC
  Administered 2022-06-14: 3.375 g via INTRAVENOUS
  Filled 2022-06-14: qty 50

## 2022-06-14 MED ORDER — DEXAMETHASONE SODIUM PHOSPHATE 10 MG/ML IJ SOLN
10.0000 mg | Freq: Once | INTRAMUSCULAR | Status: AC
  Administered 2022-06-14: 10 mg via INTRAVENOUS

## 2022-06-14 NOTE — ED Triage Notes (Signed)
PT reports sore throat for 3 days. He reports fevers. He has taken OTC pain meds for it. Endorses nasal congestion. Coughing up mucous.

## 2022-06-14 NOTE — ED Provider Notes (Signed)
  Physical Exam  BP (!) 137/90 (BP Location: Left Arm)   Pulse (!) 106   Temp 98.2 F (36.8 C) (Oral)   Resp 18   SpO2 98%   Physical Exam  Procedures  Procedures  ED Course / MDM    Medical Decision Making Care assumed at 3 PM.  Patient is here with sore throat and patient is group A strep positive.  Signed out pending CT neck.  3:51 PM CT showed bilateral tonsillar enlargement with possible tonsillitis with no abscess.  Patient has no trismus.  I examined the back of his throat and his tonsils are enlarged but is uvula is midline.  Patient was given Decadron and Zosyn in the ED.  Will discharge home with Augmentin.  Problems Addressed: Tonsillitis: acute illness or injury  Amount and/or Complexity of Data Reviewed Labs: ordered. Decision-making details documented in ED Course. Radiology: ordered.  Risk Prescription drug management.          Drenda Freeze, MD 06/14/22 [phone]

## 2022-06-14 NOTE — Progress Notes (Signed)
RT assessed patient upon arrival to room. BBS clear, SAT 98%. No distress noted, but patient's throat is very swollen. MD and RN at bedside

## 2022-06-14 NOTE — ED Provider Notes (Signed)
[facility] Provider Note   CSN: [id number] Arrival date & time: 06/14/22  1353     History  Chief Complaint  Patient presents with   Sore Throat    Evan Ramos is a 32 y.o. male.  The history is provided by the patient. No language interpreter was used.  Sore Throat This is a new problem. The current episode started more than 2 days ago. The problem occurs constantly. The problem has been gradually worsening. Pertinent negatives include no chest pain, no abdominal pain, no headaches and no shortness of breath. Nothing aggravates the symptoms. Nothing relieves the symptoms. He has tried nothing for the symptoms. The treatment provided no relief.       Home Medications Prior to Admission medications   Medication Sig Start Date End Date Taking? Authorizing Provider  amLODipine (NORVASC) 5 MG tablet Take 1 tablet (5 mg total) by mouth daily. 10/24/20   Molpus, Jenny Reichmann, MD      Allergies    Patient has no known allergies.    Review of Systems   Review of Systems  Constitutional:  Positive for chills, fatigue and fever. Negative for diaphoresis.  HENT:  Positive for congestion, rhinorrhea, sore throat and trouble swallowing. Negative for drooling and facial swelling.   Respiratory:  Positive for cough. Negative for chest tightness, shortness of breath and wheezing.   Cardiovascular:  Negative for chest pain.  Gastrointestinal:  Negative for abdominal pain, constipation, diarrhea, nausea and vomiting.  Genitourinary:  Negative for flank pain and frequency.  Musculoskeletal:  Negative for back pain, neck pain and neck stiffness.  Skin:  Negative for rash and wound.  Neurological:  Negative for headaches.  Psychiatric/Behavioral:  Negative for agitation and confusion.   All other systems reviewed and are negative.   Physical Exam Updated Vital Signs BP (!) 137/90 (BP Location: Left Arm)   Pulse (!) 106   Temp 98.2 F (36.8  C) (Oral)   Resp 18   SpO2 98%  Physical Exam Vitals and nursing note reviewed.  Constitutional:      General: He is not in acute distress.    Appearance: He is well-developed. He is not ill-appearing, toxic-appearing or diaphoretic.  HENT:     Head: Normocephalic and atraumatic.     Right Ear: External ear normal.     Left Ear: External ear normal.     Nose: Nose normal.     Mouth/Throat:     Mouth: Mucous membranes are moist. No lacerations or angioedema.     Tongue: No lesions. Tongue does not deviate from midline.     Pharynx: Uvula midline. Pharyngeal swelling, oropharyngeal exudate, posterior oropharyngeal erythema and uvula swelling present.     Tonsils: Tonsillar exudate present. No tonsillar abscesses. 2+ on the right. 2+ on the left.  Eyes:     Conjunctiva/sclera: Conjunctivae normal.     Pupils: Pupils are equal, round, and reactive to light.  Cardiovascular:     Rate and Rhythm: Normal rate.     Pulses: Normal pulses.     Heart sounds: No murmur heard. Pulmonary:     Effort: No respiratory distress.     Breath sounds: No stridor. No wheezing, rhonchi or rales.  Chest:     Chest wall: No tenderness.  Abdominal:     Palpations: Abdomen is soft.     Tenderness: There is no abdominal tenderness. There is no guarding or rebound.  Musculoskeletal:     Cervical back:  Normal range of motion and neck supple. Tenderness present.  Skin:    General: Skin is warm.     Findings: No erythema or rash.  Neurological:     Mental Status: He is alert and oriented to person, place, and time.     Cranial Nerves: No cranial nerve deficit.     Motor: No abnormal muscle tone.     Coordination: Coordination normal.     Deep Tendon Reflexes: Reflexes normal.     ED Results / Procedures / Treatments   Labs (all labs ordered are listed, but only abnormal results are displayed) Labs Reviewed  GROUP A STREP BY PCR - Abnormal; Notable for the following components:      Result Value    Group A Strep by PCR DETECTED (*)    All other components within normal limits  CBC WITH DIFFERENTIAL/PLATELET - Abnormal; Notable for the following components:   WBC 24.2 (*)    Neutro Abs 18.5 (*)    Monocytes Absolute 1.5 (*)    Eosinophils Absolute 0.6 (*)    Abs Immature Granulocytes 0.09 (*)    All other components within normal limits  COMPREHENSIVE METABOLIC PANEL - Abnormal; Notable for the following components:   Sodium 128 (*)    Calcium 8.3 (*)    All other components within normal limits  RESP PANEL BY RT-PCR (RSV, FLU A&B, COVID)  RVPGX2  LACTIC ACID, PLASMA  LIPASE, BLOOD    EKG None  Radiology CT Soft Tissue Neck W Contrast  Result Date: 06/14/2022 CLINICAL DATA:  Epiglottitis or tonsillitis suspected. Sore throat, fever, nasal congestion, and cough. EXAM: CT NECK WITH CONTRAST TECHNIQUE: Multidetector CT imaging of the neck was performed using the standard protocol following the bolus administration of intravenous contrast. RADIATION DOSE REDUCTION: This exam was performed according to the departmental dose-optimization program which includes automated exposure control, adjustment of the mA and/or kV according to patient size and/or use of iterative reconstruction technique. CONTRAST:  50m OMNIPAQUE IOHEXOL 300 MG/ML  SOLN COMPARISON:  None Available. FINDINGS: Pharynx and larynx: Symmetric, moderate enlargement of the palatine tonsils with slightly heterogeneous enhancement. No peritonsillar or retropharyngeal fluid collection. Prominent symmetric enlargement of the posterior nasopharyngeal soft tissues. Associated narrowing of the nasopharyngeal and upper oropharyngeal airway. Widely patent airway more inferiorly. No epiglottic edema. Salivary glands: No inflammation, mass, or stone. Thyroid: Unremarkable. Lymph nodes: No enlarged or suspicious lymph nodes in the neck. Vascular: Major vascular structures of the neck are grossly patent. Limited intracranial:  Unremarkable. Visualized orbits: Unremarkable. Mastoids and visualized paranasal sinuses: Minimal mucosal thickening in the paranasal sinuses. Clear mastoid air cells. Skeleton: Multiple dental caries and periapical lucencies. Upper chest: Clear lung apices. Other: None. IMPRESSION: Bilateral tonsillar enlargement/possible tonsillitis.  No abscess. Electronically Signed   By: ALogan BoresM.D.   On: 06/14/2022 15:21   DG Chest Portable 1 View  Result Date: 06/14/2022 CLINICAL DATA:  Short of breath EXAM: PORTABLE CHEST 1 VIEW COMPARISON:  05/19/2021 FINDINGS: The heart size and mediastinal contours are within normal limits. Both lungs are clear. The visualized skeletal structures are unremarkable. IMPRESSION: No active disease. Electronically Signed   By: MRanda NgoM.D.   On: 06/14/2022 14:45    Procedures Procedures    Medications Ordered in ED Medications  dexamethasone (DECADRON) injection 10 mg (10 mg Intravenous Given 06/14/22 1419)  iohexol (OMNIPAQUE) 300 MG/ML solution 100 mL (75 mLs Intravenous Contrast Given 06/14/22 1502)  piperacillin-tazobactam (ZOSYN) IVPB 3.375 g (0 g Intravenous  Stopped 06/14/22 1626)    ED Course/ Medical Decision Making/ A&P                             Medical Decision Making Amount and/or Complexity of Data Reviewed Labs: ordered. Radiology: ordered.  Risk Prescription drug management.    Haeden Marchak is a 32 y.o. male with a past medical history significant for hypertension who presents with sore throat, fevers, chills, cough, and throat swelling.  According to patient, his daughter recently had strep throat last week and has been treated and is doing well.  He reports that he started having sore throat the last few days and feels like her throat is swelling.  He is able to swallow some but has had some spitting up his saliva.  He can still move his mouth and denies difficulty breathing currently.  Denies any chest pain or shortness of breath  but does have some cough.  He reports some neck soreness but denies neck stiffness.  Denies any other complaints or rashes.  Denies nausea vomiting or diarrhea.  No headache reported.  He has a history of strep throat he reports.  On exam, lungs were clear.  No stridor.  No wheezing.  Chest nontender.  Abdomen nontender.  Patient has normal range of motion.  Did not have tenderness under the tongue on exam but did have some tenderness in his throat.  Patient's oropharynx did have erythema and some exudates and was very swollen.  Uvula was midline but swollen.  No evidence of large PTA as it was midline however due to the amount of swelling, will get imaging.  Had a discussion with patient he agrees with labs and imaging to rule out hidden abscess.  Patient given a shot of Decadron to improve the swelling and discomfort.  Will get strep swab and viral testing.  Will get chest x-ray and labs.  Care transferred to oncoming team to wait for results but anticipate discharge home if he improves and has reassuring workup without a surgical problem being discovered on imaging.         Final Clinical Impression(s) / ED Diagnoses Final diagnoses:  Tonsillitis    Rx / DC Orders ED Discharge Orders          Ordered    amoxicillin-clavulanate (AUGMENTIN) 875-125 MG tablet  Every 12 hours        06/14/22 1552            Clinical Impression: 1. Tonsillitis     Disposition: Care transferred to Dr. Darl Householder to await reassessment after CT scan.  If patient continues to breathe well and has no difficulty swallowing and has reassuring CT, dissipate discharge with antibiotics for likely treatment of strep throat.  This note was prepared with assistance of Systems analyst. Occasional wrong-word or sound-a-like substitutions may have occurred due to the inherent limitations of voice recognition software.     Mykalah Saari, Gwenyth Allegra, MD 06/14/22 2141

## 2022-06-14 NOTE — Discharge Instructions (Signed)
You have tonsillitis from strep   Take Augmentin twice daily for a week  See your doctor for follow-up.  Take Tylenol or Motrin for pain or fever   Return to ER if you have worse trouble swallowing, fever, dehydration

## 2022-06-14 NOTE — ED Notes (Signed)
Patient transported to CT 

## 2024-01-17 ENCOUNTER — Emergency Department (HOSPITAL_BASED_OUTPATIENT_CLINIC_OR_DEPARTMENT_OTHER)
Admission: EM | Admit: 2024-01-17 | Discharge: 2024-01-17 | Disposition: A | Discharge status: Home / Self Care | Attending: Emergency Medicine | Admitting: Emergency Medicine

## 2024-01-17 ENCOUNTER — Other Ambulatory Visit: Payer: Self-pay

## 2024-01-17 ENCOUNTER — Emergency Department (HOSPITAL_BASED_OUTPATIENT_CLINIC_OR_DEPARTMENT_OTHER)

## 2024-01-17 ENCOUNTER — Encounter (HOSPITAL_BASED_OUTPATIENT_CLINIC_OR_DEPARTMENT_OTHER): Payer: Self-pay | Admitting: Emergency Medicine

## 2024-01-17 DIAGNOSIS — R200 Anesthesia of skin: Secondary | ICD-10-CM | POA: Insufficient documentation

## 2024-01-17 DIAGNOSIS — I10 Essential (primary) hypertension: Secondary | ICD-10-CM | POA: Diagnosis not present

## 2024-01-17 DIAGNOSIS — R202 Paresthesia of skin: Secondary | ICD-10-CM | POA: Diagnosis present

## 2024-01-17 LAB — CBC WITH DIFFERENTIAL/PLATELET
HCT: 41.6 % (ref 39.0–52.0)
Hemoglobin: 14.2 g/dL (ref 13.0–17.0)
MCH: 29.7 pg (ref 26.0–34.0)
MCHC: 34.1 g/dL (ref 30.0–36.0)
MCV: 87 fL (ref 80.0–100.0)
Platelets: 275 K/uL (ref 150–400)
RBC: 4.78 MIL/uL (ref 4.22–5.81)
RDW: 13.2 % (ref 11.5–15.5)
WBC: 13.4 K/uL — ABNORMAL HIGH (ref 4.0–10.5)
nRBC: 0 % (ref 0.0–0.2)

## 2024-01-17 LAB — DIFFERENTIAL
Abs Immature Granulocytes: 0.04 K/uL (ref 0.00–0.07)
Basophils Absolute: 0.1 K/uL (ref 0.0–0.1)
Basophils Relative: 0 %
Eosinophils Absolute: 0.4 K/uL (ref 0.0–0.5)
Eosinophils Relative: 3 %
Immature Granulocytes: 0 %
Lymphocytes Relative: 39 %
Lymphs Abs: 5.4 K/uL — ABNORMAL HIGH (ref 0.7–4.0)
Monocytes Absolute: 0.7 K/uL (ref 0.1–1.0)
Monocytes Relative: 5 %
Neutro Abs: 7.4 K/uL (ref 1.7–7.7)
Neutrophils Relative %: 53 %

## 2024-01-17 LAB — COMPREHENSIVE METABOLIC PANEL WITH GFR
ALT: 20 U/L (ref 0–44)
AST: 21 U/L (ref 15–41)
Albumin: 4.3 g/dL (ref 3.5–5.0)
Alkaline Phosphatase: 91 U/L (ref 38–126)
Anion gap: 10 (ref 5–15)
BUN: 11 mg/dL (ref 6–20)
CO2: 28 mmol/L (ref 22–32)
Calcium: 9.5 mg/dL (ref 8.9–10.3)
Chloride: 103 mmol/L (ref 98–111)
Creatinine, Ser: 1.21 mg/dL (ref 0.61–1.24)
GFR, Estimated: >60 mL/min (ref >60)
Glucose, Bld: 104 mg/dL — ABNORMAL HIGH (ref 70–99)
Potassium: 4.1 mmol/L (ref 3.5–5.1)
Sodium: 140 mmol/L (ref 135–145)
Total Bilirubin: 0.3 mg/dL (ref 0.0–1.2)
Total Protein: 7 g/dL (ref 6.5–8.1)

## 2024-01-17 MED ORDER — PREDNISONE 50 MG PO TABS
60.0000 mg | ORAL_TABLET | Freq: Once | ORAL | Status: AC
  Administered 2024-01-17: 60 mg via ORAL
  Filled 2024-01-17: qty 1

## 2024-01-17 MED ORDER — IBUPROFEN 800 MG PO TABS: 800.0000 mg | ORAL_TABLET | Freq: Three times a day (TID) | ORAL | 0 refills | Status: AC | PRN

## 2024-01-17 MED ORDER — PREDNISONE 20 MG PO TABS: 40.0000 mg | ORAL_TABLET | Freq: Every day | ORAL | 0 refills | Status: AC

## 2024-01-17 NOTE — ED Provider Notes (Signed)
 Emergency Department Provider Note   I have reviewed the triage vital signs and the nursing notes.   HISTORY  Chief Complaint Numbness   HPI Evan Ramos is a 33 y.o. male with PMH of HTN presents to the ED with tingling/numbness in the bilateral upper extremities when laying flat. Symptoms have been present for the past 2 weeks.  He states anytime he is lying flat he will develop tingling and numbness in both arms and hands.  Both extremities feel slightly swollen.  If he stands and shakes out his arms sensation returns.  No color change to the skin.  No unilateral swelling.  No leg symptoms.  Patient denies any chest pain or shortness of breath. No neck pain or injuries noted.    Past Medical History:  Diagnosis Date   Hypertension     Review of Systems  Constitutional: No fever/chills Cardiovascular: Denies chest pain. Respiratory: Denies shortness of breath. Gastrointestinal: No abdominal pain. Musculoskeletal: Negative for back pain. Skin: Negative for rash. Neurological: Negative for headaches. Positive bilateral arm numbness when flat.   ____________________________________________   PHYSICAL EXAM:  VITAL SIGNS: ED Triage Vitals  Encounter Vitals Group     BP 01/17/24 0109 (!) 156/87     Pulse Rate 01/17/24 0109 99     Resp 01/17/24 0109 16     Temp 01/17/24 0109 99.5 F (37.5 C)     Temp Source 01/17/24 0109 Oral     SpO2 01/17/24 0109 99 %     Weight 01/17/24 0110 190 lb (86.2 kg)     Height 01/17/24 0110 5' 4 (1.626 m)    Constitutional: Alert and oriented. Well appearing and in no acute distress. Eyes: Conjunctivae are normal. Head: Atraumatic. Nose: No congestion/rhinnorhea. Mouth/Throat: Mucous membranes are moist.   Neck: No stridor. No midline tenderness. No visible swelling.  Cardiovascular: Normal rate, regular rhythm. Good peripheral circulation. Grossly normal heart sounds. 2+ radial pulses bilaterally with normal capillary refill.   Respiratory: Normal respiratory effort.  No retractions. Lungs CTAB. Gastrointestinal: No distention.  Musculoskeletal: No gross deformities of extremities. Normal ROM of the bilateral shoulder.  Neurologic:  Normal speech and language. Normal strength and sensation in the bilateral upper extremities.  Skin:  Skin is warm, dry and intact. No rash noted.  ____________________________________________   LABS (all labs ordered are listed, but only abnormal results are displayed)  Labs Reviewed  CBC WITH DIFFERENTIAL/PLATELET - Abnormal; Notable for the following components:      Result Value   WBC 13.4 (*)    All other components within normal limits  COMPREHENSIVE METABOLIC PANEL WITH GFR - Abnormal; Notable for the following components:   Glucose, Bld 104 (*)    All other components within normal limits  DIFFERENTIAL - Abnormal; Notable for the following components:   Lymphs Abs 5.4 (*)    All other components within normal limits   ____________________________________________  EKG   EKG Interpretation Date/Time:  Tuesday January 17 2024 01:12:46 EDT Ventricular Rate:  89 PR Interval:  137 QRS Duration:  87 QT Interval:  343 QTC Calculation: 418 R Axis:   -7  Text Interpretation: Sinus rhythm Borderline ST elevation, lateral leads Similar to 2023 tracing No STEMI Confirmed by Darra Chew [phone]) on 01/17/2024 1:22:04 AM        ____________________________________________  RADIOLOGY  CT Cervical Spine Wo Contrast Result Date: 01/17/2024 CLINICAL DATA:  Neck trauma, impaired ROM (Age 39-64y). Bilateral arm and hand numbness EXAM: CT CERVICAL SPINE WITHOUT  CONTRAST TECHNIQUE: Multidetector CT imaging of the cervical spine was performed without intravenous contrast. Multiplanar CT image reconstructions were also generated. RADIATION DOSE REDUCTION: This exam was performed according to the departmental dose-optimization program which includes automated exposure control,  adjustment of the mA and/or kV according to patient size and/or use of iterative reconstruction technique. COMPARISON:  None Available. FINDINGS: Alignment: Normal Skull base and vertebrae: No acute fracture. No primary bone lesion or focal pathologic process. Soft tissues and spinal canal: No prevertebral fluid or swelling. No visible canal hematoma. Disc levels:  Normal Upper chest: Negative Other: None IMPRESSION: No bony abnormality. Electronically Signed   By: Franky Crease M.D.   On: 01/17/2024 01:54    ____________________________________________   PROCEDURES  Procedure(s) performed:   Procedures  None  ____________________________________________   INITIAL IMPRESSION / ASSESSMENT AND PLAN / ED COURSE  Pertinent labs & imaging results that were available during my care of the patient were reviewed by me and considered in my medical decision making (see chart for details).   This patient is Presenting for Evaluation of intermittent paresthesias, which does require a range of treatment options, and is a complaint that involves a moderate risk of morbidity and mortality.  The Differential Diagnoses include cervical radiculopathy, vascular compromise, electrolyte disturbance, etc.  Critical Interventions-    Medications  predniSONE (DELTASONE) tablet 60 mg (60 mg Oral Given 01/17/24 0221)     Clinical Laboratory Tests Ordered, included CBC without leukocytosis. No AKI. Normal K.   Radiologic Tests Ordered, included CT c spine. I independently interpreted the images and agree with radiology interpretation.   Medical Decision Making: Summary:  Patient presents emergency department with intermittent tingling/pins-and-needles in the bilateral arms and hands with lying flat.  I do not appreciate any vascular compromise on exam.  The extremities are well-perfused.  I do not usually do unilateral upper extremity swelling.  Active symptoms.  Exam history are not consistent with stroke or  acute emergency.  CT cervical spine for screening exam and reassess.  Reevaluation with update and discussion with reassuring.  Plan burst with possible radicular pathology and advise close PCP follow-up  Patient's presentation is most consistent with acute, uncomplicated illness.   Disposition: discharge  ____________________________________________  FINAL CLINICAL IMPRESSION(S) / ED DIAGNOSES  Final diagnoses:  Paresthesias     NEW OUTPATIENT MEDICATIONS STARTED DURING THIS VISIT:  Discharge Medication List as of 01/17/2024  2:18 AM     START taking these medications   Details  ibuprofen (ADVIL) 800 MG tablet Take 1 tablet (800 mg total) by mouth every 8 (eight) hours as needed., Starting Tue 01/17/2024, Normal    predniSONE (DELTASONE) 20 MG tablet Take 2 tablets (40 mg total) by mouth daily for 4 days., Starting Tue 01/17/2024, Until Sat 01/21/2024, Normal        Note:  This document was prepared using Dragon voice recognition software and may include unintentional dictation errors.  Fonda Law, MD, [facility]    Jayda White, Fonda MATSU, MD 01/17/24 [phone]

## 2024-01-17 NOTE — ED Notes (Signed)
 Patient transported to CT

## 2024-01-17 NOTE — ED Notes (Signed)
 Pt states 2 weeks ago he noticed bilateral hands and arms numbness and tingling when he lies down to sleep. Resolves when he gets up and moves around

## 2024-01-17 NOTE — ED Triage Notes (Signed)
 Pt c/o bilateral arms and hands going numb when he lies down to sleep, first noticed about 2 weeks ago. Symptoms resolve when pt is up and moving around. Ambulatory w/o issue, no obvious neuro deficits noted. NIHSS 0. No significant PMH.

## 2024-01-17 NOTE — Discharge Instructions (Signed)
 Please take the steroid medications as prescribed.  You may take ibuprofen as needed with any discomfort.  I would like for you to coordinate closely with your primary care doctor.  Can help direct further evaluation of your symptoms.
